# Patient Record
Sex: Female | Born: 1950
Health system: Southern US, Community
[De-identification: ages and names within clinical notes are randomized; demographics above are authoritative.]

## PROBLEM LIST (undated history)

## (undated) DIAGNOSIS — I10 Essential (primary) hypertension: Secondary | ICD-10-CM

## (undated) DIAGNOSIS — J449 Chronic obstructive pulmonary disease, unspecified: Secondary | ICD-10-CM

## (undated) DIAGNOSIS — I2510 Atherosclerotic heart disease of native coronary artery without angina pectoris: Secondary | ICD-10-CM

## (undated) HISTORY — PX: CARDIAC CATHETERIZATION: SHX172

---

## 2017-05-25 ENCOUNTER — Encounter (HOSPITAL_BASED_OUTPATIENT_CLINIC_OR_DEPARTMENT_OTHER): Payer: Self-pay | Admitting: *Deleted

## 2017-05-25 ENCOUNTER — Emergency Department (HOSPITAL_BASED_OUTPATIENT_CLINIC_OR_DEPARTMENT_OTHER): Payer: Medicare Other

## 2017-05-25 ENCOUNTER — Other Ambulatory Visit: Payer: Self-pay

## 2017-05-25 ENCOUNTER — Emergency Department (HOSPITAL_BASED_OUTPATIENT_CLINIC_OR_DEPARTMENT_OTHER)
Admission: EM | Admit: 2017-05-25 | Discharge: 2017-05-25 | Disposition: A | Discharge status: Home / Self Care | Payer: Medicare Other | Attending: Emergency Medicine | Admitting: Emergency Medicine

## 2017-05-25 DIAGNOSIS — W01198A Fall on same level from slipping, tripping and stumbling with subsequent striking against other object, initial encounter: Secondary | ICD-10-CM

## 2017-05-25 DIAGNOSIS — I2510 Atherosclerotic heart disease of native coronary artery without angina pectoris: Secondary | ICD-10-CM

## 2017-05-25 DIAGNOSIS — Y929 Unspecified place or not applicable: Secondary | ICD-10-CM

## 2017-05-25 DIAGNOSIS — J449 Chronic obstructive pulmonary disease, unspecified: Secondary | ICD-10-CM

## 2017-05-25 DIAGNOSIS — I10 Essential (primary) hypertension: Secondary | ICD-10-CM

## 2017-05-25 DIAGNOSIS — Z7982 Long term (current) use of aspirin: Secondary | ICD-10-CM

## 2017-05-25 DIAGNOSIS — Y9389 Activity, other specified: Secondary | ICD-10-CM

## 2017-05-25 DIAGNOSIS — Y998 Other external cause status: Secondary | ICD-10-CM

## 2017-05-25 DIAGNOSIS — Z79899 Other long term (current) drug therapy: Secondary | ICD-10-CM

## 2017-05-25 DIAGNOSIS — Z87891 Personal history of nicotine dependence: Secondary | ICD-10-CM

## 2017-05-25 DIAGNOSIS — S92425A Nondisplaced fracture of distal phalanx of left great toe, initial encounter for closed fracture: Principal | ICD-10-CM

## 2017-05-25 DIAGNOSIS — S99922A Unspecified injury of left foot, initial encounter: Secondary | ICD-10-CM | POA: Diagnosis present

## 2017-05-25 HISTORY — DX: Atherosclerotic heart disease of native coronary artery without angina pectoris: I25.10

## 2017-05-25 HISTORY — DX: Chronic obstructive pulmonary disease, unspecified: J44.9

## 2017-05-25 HISTORY — DX: Essential (primary) hypertension: I10

## 2017-05-25 MED ORDER — HYDROCODONE-ACETAMINOPHEN 5-325 MG PO TABS: 2.0000 | ORAL_TABLET | Freq: Once | ORAL | Status: DC

## 2017-05-25 MED ORDER — HYDROCODONE-ACETAMINOPHEN 5-325 MG PO TABS
1.0000 | ORAL_TABLET | Freq: Once | ORAL | Status: AC
  Administered 2017-05-25: 1 via ORAL
  Filled 2017-05-25: qty 1

## 2017-05-25 MED ORDER — TRAMADOL HCL 50 MG PO TABS: 50.0000 mg | ORAL_TABLET | Freq: Four times a day (QID) | ORAL | 0 refills | Status: AC | PRN

## 2017-05-25 MED ORDER — HYDROCODONE-ACETAMINOPHEN 5-325 MG PO TABS: 1.0000 | ORAL_TABLET | Freq: Four times a day (QID) | ORAL | 0 refills | Status: AC | PRN

## 2017-05-25 MED FILL — HYDROCODON-APAP 5-325: 5-325 | 2 days supply | Qty: 8 | Fill #0

## 2017-05-25 NOTE — Discharge Instructions (Addendum)
For your toe fracture,  - Wear the post-operative shoe for the next 2 weeks, as often as possible, to provide support - Use crutches as needed for the next week, for pain - you can put weight on your foot if you feel comfortable - Elevated the foot at night and when resting - Take over-the-counter ibuprofen or advil every 6 hours for pain. Take the prescribed medicine for severe pain.  Follow-up with your regular doctor in 7-10 days.

## 2017-05-25 NOTE — ED Notes (Signed)
ED Provider at bedside. 

## 2017-05-25 NOTE — ED Triage Notes (Signed)
Pt c/o left foot injury x 5 days

## 2017-05-25 NOTE — ED Provider Notes (Signed)
[facility] EMERGENCY DEPARTMENT Provider Note   CSN: [id number] Arrival date & time: 05/25/17  1310     History   Chief Complaint Chief Complaint  Patient presents with  . Foot Injury    HPI Kayla Bray is a 67 y.o. female.  HPI   67 yo F here with foot pain. Pt was walking 4 days ago when she tripped over a concrete block. Stubbed her left first toe on it then tripped. She was able to catch her balance and did not hit her head or fall. Since then, she's had an aching, throbbing, severe foot pain. Pain is worse when putting weight on it. She has tried soaking it in [employer], with minimal relief. No other drugs/meds taken. No open wounds. No prior wound to the area.  Past Medical History:  Diagnosis Date  . COPD (chronic obstructive pulmonary disease) (HCC)   . Coronary artery disease   . Hypertension     There are no active problems to display for this patient.   Past Surgical History:  Procedure Laterality Date  . CARDIAC CATHETERIZATION       OB History   None      Home Medications    Prior to Admission medications   Medication Sig Start Date End Date Taking? Authorizing Provider  aspirin 81 MG chewable tablet Chew by mouth daily.   Yes [provider]  cloNIDine (CATAPRES) 0.1 MG tablet Take 0.1 mg by mouth 2 (two) times daily.   Yes [provider]  hydrALAZINE (APRESOLINE) 25 MG tablet Take 25 mg by mouth 3 (three) times daily.   Yes [provider]  HYDROcodone-acetaminophen (NORCO/VICODIN) 5-325 MG tablet Take 1 tablet by mouth every 6 (six) hours as needed for severe pain. 05/25/17   Shaune Pollack, MD  traMADol (ULTRAM) 50 MG tablet Take 1 tablet (50 mg total) by mouth every 6 (six) hours as needed for severe pain. 05/25/17   Shaune Pollack, MD    Family History No family history on file.  Social History Social History   Tobacco Use  . Smoking status: Former Smoker  Substance Use Topics  . Alcohol use:  Not Currently  . Drug use: Never     Allergies   Meloxicam and Motrin [ibuprofen]   Review of Systems Review of Systems  Constitutional: Negative for chills and fever.  Respiratory: Negative for shortness of breath.   Cardiovascular: Negative for chest pain.  Musculoskeletal: Positive for arthralgias, gait problem and joint swelling. Negative for neck pain.  Skin: Negative for rash and wound.  Allergic/Immunologic: Negative for immunocompromised state.  Neurological: Negative for weakness and numbness.  Hematological: Does not bruise/bleed easily.  All other systems reviewed and are negative.    Physical Exam Updated Vital Signs BP (!) 202/106 (BP Location: Right Arm)   Pulse 62   Temp 98.1 F (36.7 C) (Oral)   Resp 20   Ht  (1.854 m)   Wt 74.8 kg (165 lb)   SpO2 100%   BMI 21.77 kg/m   Physical Exam  Constitutional: She is oriented to person, place, and time. She appears well-developed and well-nourished. No distress.  HENT:  Head: Normocephalic and atraumatic.  Eyes: Conjunctivae are normal.  Neck: Neck supple.  Cardiovascular: Normal rate, regular rhythm and normal heart sounds.  Pulmonary/Chest: Effort normal. No respiratory distress. She has no wheezes.  Abdominal: She exhibits no distension.  Musculoskeletal: She exhibits no edema.  Neurological: She is alert and oriented  to person, place, and time. She exhibits normal muscle tone.  Skin: Skin is warm. Capillary refill takes less than 2 seconds. No rash noted.  Nursing note and vitals reviewed.   LOWER EXTREMITY EXAM: LEFT  INSPECTION & PALPATION: Moderate TTP overlying dorsum of distal first toe, with mild edema. No open wounds. Pain with palpation. No other pain or swelling. No open wounds. No malrotation or deformity.  SENSORY: sensation is intact to light touch in:  Superficial peroneal nerve distribution (over dorsum of foot) Deep peroneal nerve distribution (over first dorsal web  space) Sural nerve distribution (over lateral aspect 5th metatarsal) Saphenous nerve distribution (over medial instep)  MOTOR:  + Motor EHL (great toe dorsiflexion) + FHL (great toe plantar flexion)  + TA (ankle dorsiflexion)  + GSC (ankle plantar flexion)  VASCULAR: 2+ dorsalis pedis and posterior tibialis pulses Capillary refill < 2 sec, toes warm and well-perfused  COMPARTMENTS: Soft, warm, well-perfused No pain with passive extension No parethesias     ED Treatments / Results  Labs (all labs ordered are listed, but only abnormal results are displayed) Labs Reviewed - No data to display  EKG None  Radiology Dg Foot Complete Left  Result Date: 05/25/2017 CLINICAL DATA:  Anterior foot pain after hitting something 4 days ago. EXAM: LEFT FOOT - COMPLETE 3+ VIEW COMPARISON:  None. FINDINGS: There is a nondisplaced fracture through the fifth proximal phalanx shaft with surrounding sclerosis. Oblique lucency through the medial base of the first distal phalanx. Joint spaces are preserved. Minimal degenerative changes of the first MTP joint. Osteopenia. Vascular calcifications. IMPRESSION: 1. Oblique lucency through the medial base of the first distal phalanx may reflect a nondisplaced fracture. Correlate with point tenderness. 2. Nondisplaced fracture through the fifth proximal phalanx shaft with surrounding sclerosis and somewhat corticated margins, suggesting chronic nonunited fracture. Correlate with point tenderness. Electronically Signed   By: Obie Dredge M.D.   On: 05/25/2017 13:41    Procedures Procedures (including critical care time)  Medications Ordered in ED Medications  HYDROcodone-acetaminophen (NORCO/VICODIN) 5-325 MG per tablet 1 tablet (1 tablet Oral Given 05/25/17 1535)     Initial Impression / Assessment and Plan / ED Course  I have reviewed the triage vital signs and the nursing notes.  Pertinent labs & imaging results that were available during my  care of the patient were reviewed by me and considered in my medical decision making (see chart for details).     67 yo F here with foot pain s/p tripping injury 4-5 days ago. Imaging shows non-displaced distal phalanx fx, likely old fifth toe fx. No malrotation. No other injuries. No fall, head injury, or other wounds/areas of pain. Will place in post-op shoe, buddy tape, have PCP follow-up. Tramadol PRN severe pain with tylenol regularly. RICE.  Final Clinical Impressions(s) / ED Diagnoses   Final diagnoses:  Closed nondisplaced fracture of distal phalanx of left great toe, initial encounter       Shaune Pollack, MD 05/25/17 1541

## 2019-07-26 ENCOUNTER — Emergency Department (HOSPITAL_BASED_OUTPATIENT_CLINIC_OR_DEPARTMENT_OTHER): Payer: Medicare Other

## 2019-07-26 ENCOUNTER — Other Ambulatory Visit: Payer: Self-pay

## 2019-07-26 ENCOUNTER — Encounter (HOSPITAL_BASED_OUTPATIENT_CLINIC_OR_DEPARTMENT_OTHER): Payer: Self-pay | Admitting: *Deleted

## 2019-07-26 ENCOUNTER — Emergency Department (HOSPITAL_BASED_OUTPATIENT_CLINIC_OR_DEPARTMENT_OTHER)
Admission: EM | Admit: 2019-07-26 | Discharge: 2019-07-26 | Disposition: A | Discharge status: Home / Self Care | Payer: Medicare Other | Attending: Emergency Medicine | Admitting: Emergency Medicine

## 2019-07-26 DIAGNOSIS — Z79899 Other long term (current) drug therapy: Secondary | ICD-10-CM | POA: Insufficient documentation

## 2019-07-26 DIAGNOSIS — Z7982 Long term (current) use of aspirin: Secondary | ICD-10-CM | POA: Diagnosis not present

## 2019-07-26 DIAGNOSIS — I2510 Atherosclerotic heart disease of native coronary artery without angina pectoris: Secondary | ICD-10-CM | POA: Insufficient documentation

## 2019-07-26 DIAGNOSIS — M79675 Pain in left toe(s): Secondary | ICD-10-CM | POA: Diagnosis present

## 2019-07-26 DIAGNOSIS — J449 Chronic obstructive pulmonary disease, unspecified: Secondary | ICD-10-CM | POA: Diagnosis not present

## 2019-07-26 DIAGNOSIS — M10072 Idiopathic gout, left ankle and foot: Secondary | ICD-10-CM | POA: Insufficient documentation

## 2019-07-26 DIAGNOSIS — I10 Essential (primary) hypertension: Secondary | ICD-10-CM | POA: Diagnosis not present

## 2019-07-26 DIAGNOSIS — F17200 Nicotine dependence, unspecified, uncomplicated: Secondary | ICD-10-CM | POA: Insufficient documentation

## 2019-07-26 DIAGNOSIS — M109 Gout, unspecified: Secondary | ICD-10-CM

## 2019-07-26 MED ORDER — PREDNISONE 50 MG PO TABS
60.0000 mg | ORAL_TABLET | Freq: Once | ORAL | Status: AC
  Administered 2019-07-26: 60 mg via ORAL
  Filled 2019-07-26: qty 1

## 2019-07-26 MED ORDER — PREDNISONE 10 MG PO TABS
40.0000 mg | ORAL_TABLET | Freq: Every day | ORAL | 0 refills | Status: AC
Start: 2019-07-26 — End: 2019-07-31

## 2019-07-26 MED FILL — predniSONE 10 MG TABS: 10 | 5 days supply | Qty: 20 | Fill #0

## 2019-07-26 NOTE — ED Provider Notes (Signed)
[facility] EMERGENCY DEPARTMENT Provider Note   CSN: [id number] Arrival date & time: 07/26/19  1400     History Chief Complaint  Patient presents with  . Foot Pain    Kayla Bray is a 69 y.o. female.  Patient is a 69 year old female has past medical history of COPD, hypertension presenting to the emergency department for complaints of left big toe pain.  Patient reports that yesterday she began to feel itching and pain in the MTP of her great toe on the left.  She reports that she feels like a spider bit her but did not see any kind of insect or spider or feel a specific bite.  She reports rubbing alcohol on the area but she is having significant pain.  Able to ambulate.  No fever, chills, rash        Past Medical History:  Diagnosis Date  . COPD (chronic obstructive pulmonary disease) (HCC)   . Coronary artery disease   . Hypertension     There are no problems to display for this patient.   Past Surgical History:  Procedure Laterality Date  . CARDIAC CATHETERIZATION       OB History   No obstetric history on file.     No family history on file.  Social History   Tobacco Use  . Smoking status: Current Every Day Smoker  . Smokeless tobacco: Never Used  Substance Use Topics  . Alcohol use: Not Currently  . Drug use: Never    Home Medications Prior to Admission medications   Medication Sig Start Date End Date Taking? Authorizing Provider  aspirin 81 MG chewable tablet Chew by mouth daily.   Yes [provider]  cloNIDine (CATAPRES) 0.1 MG tablet Take 0.1 mg by mouth 2 (two) times daily.   Yes [provider]  hydrALAZINE (APRESOLINE) 25 MG tablet Take 25 mg by mouth 3 (three) times daily.   Yes [provider]  HYDROcodone-acetaminophen (NORCO/VICODIN) 5-325 MG tablet Take 1 tablet by mouth every 6 (six) hours as needed for severe pain. 05/25/17   Shaune Pollack, MD  traMADol (ULTRAM) 50 MG tablet Take 1 tablet (50 mg  total) by mouth every 6 (six) hours as needed for severe pain. 05/25/17   Shaune Pollack, MD    Allergies    Meloxicam and Motrin [ibuprofen]  Review of Systems   Review of Systems  Constitutional: Negative for chills and fever.  Musculoskeletal: Positive for arthralgias and joint swelling. Negative for back pain and gait problem.  Neurological: Negative for numbness.  All other systems reviewed and are negative.   Physical Exam Updated Vital Signs BP (!) 158/98   Pulse 91   Temp 98.9 F (37.2 C) (Oral)   Resp 18   Ht 6\' 1"  (1.854 m)   Wt 72.6 kg   SpO2 98%   BMI 21.11 kg/m   Physical Exam Vitals and nursing note reviewed.  Constitutional:      General: She is not in acute distress.    Appearance: Normal appearance. She is not ill-appearing, toxic-appearing or diaphoretic.  HENT:     Head: Normocephalic.  Eyes:     Conjunctiva/sclera: Conjunctivae normal.  Pulmonary:     Effort: Pulmonary effort is normal.  Musculoskeletal:     Comments: Swelling with  TTP to the left MTP of the great toe.  Skin:    General: Skin is dry.     Findings: No bruising or erythema.  Neurological:     General:  No focal deficit present.     Mental Status: She is alert.     Sensory: No sensory deficit.  Psychiatric:        Mood and Affect: Mood normal.     ED Results / Procedures / Treatments   Labs (all labs ordered are listed, but only abnormal results are displayed) Labs Reviewed - No data to display  EKG None  Radiology No results found.  Procedures Procedures (including critical care time)  Medications Ordered in ED Medications  predniSONE (DELTASONE) tablet 60 mg (has no administration in time range)    ED Course  I have reviewed the triage vital signs and the nursing notes.  Pertinent labs & imaging results that were available during my care of the patient were reviewed by me and considered in my medical decision making (see chart for details).  Clinical  Course as of Jul 27 822  Tue Jul 26, 2019  1548 Patient with acute left great toe MTP pain, atraumatic. Findings on physical exam and xray suggestive of gout. Tx with prednisone, advised on return precautions .   [KM]    Clinical Course User Index [KM] Jeral Pinch   MDM Rules/Calculators/A&P                         Based on review of vitals, medical screening exam, lab work and/or imaging, there does not appear to be an acute, emergent etiology for the patient's symptoms. Counseled pt on good return precautions and encouraged both PCP and ED follow-up as needed.  Prior to discharge, I also discussed incidental imaging findings with patient in detail and advised appropriate, recommended follow-up in detail.  Clinical Impression: 1. Acute gout involving toe of left foot, unspecified cause     Disposition: Discharge  Prior to providing a prescription for a controlled substance, I independently reviewed the patient's recent prescription history on the [place] Controlled Substance Reporting System. The patient had no recent or regular prescriptions and was deemed appropriate for a brief, less than 3 day prescription of narcotic for acute analgesia.  This note was prepared with assistance of Conservation officer, historic buildings. Occasional wrong-word or sound-a-like substitutions may have occurred due to the inherent limitations of voice recognition software.  Final Clinical Impression(s) / ED Diagnoses Final diagnoses:  None    Rx / DC Orders ED Discharge Orders    None       Tegeler, Canary Brim, MD 07/27/19 1552

## 2019-07-26 NOTE — ED Triage Notes (Signed)
Swelling to the top of her left foot x 3 days. She feels like she was bit by a spider.

## 2019-07-26 NOTE — Discharge Instructions (Signed)
Thank you for allowing me to care for you today. Please return to the emergency department if you have new or worsening symptoms. Take your medications as instructed.  ° °

## 2020-05-07 ENCOUNTER — Emergency Department (HOSPITAL_COMMUNITY): Payer: No Typology Code available for payment source

## 2020-05-07 ENCOUNTER — Emergency Department (HOSPITAL_COMMUNITY)
Admission: EM | Admit: 2020-05-07 | Discharge: 2020-05-08 | Disposition: A | Discharge status: Home / Self Care | Payer: No Typology Code available for payment source | Attending: Emergency Medicine | Admitting: Emergency Medicine

## 2020-05-07 ENCOUNTER — Ambulatory Visit (HOSPITAL_COMMUNITY)
Admission: EM | Admit: 2020-05-07 | Discharge: 2020-05-07 | Disposition: A | Discharge status: Home / Self Care | Payer: Medicare Other

## 2020-05-07 ENCOUNTER — Other Ambulatory Visit: Payer: Self-pay

## 2020-05-07 ENCOUNTER — Encounter (HOSPITAL_COMMUNITY): Payer: Self-pay

## 2020-05-07 DIAGNOSIS — N179 Acute kidney failure, unspecified: Secondary | ICD-10-CM | POA: Insufficient documentation

## 2020-05-07 DIAGNOSIS — F17200 Nicotine dependence, unspecified, uncomplicated: Secondary | ICD-10-CM | POA: Insufficient documentation

## 2020-05-07 DIAGNOSIS — I10 Essential (primary) hypertension: Secondary | ICD-10-CM | POA: Insufficient documentation

## 2020-05-07 DIAGNOSIS — Y92410 Unspecified street and highway as the place of occurrence of the external cause: Secondary | ICD-10-CM | POA: Insufficient documentation

## 2020-05-07 DIAGNOSIS — Z7982 Long term (current) use of aspirin: Secondary | ICD-10-CM | POA: Diagnosis not present

## 2020-05-07 DIAGNOSIS — J449 Chronic obstructive pulmonary disease, unspecified: Secondary | ICD-10-CM | POA: Diagnosis not present

## 2020-05-07 DIAGNOSIS — S0990XA Unspecified injury of head, initial encounter: Secondary | ICD-10-CM | POA: Diagnosis present

## 2020-05-07 DIAGNOSIS — I2510 Atherosclerotic heart disease of native coronary artery without angina pectoris: Secondary | ICD-10-CM | POA: Diagnosis not present

## 2020-05-07 DIAGNOSIS — S060X0A Concussion without loss of consciousness, initial encounter: Secondary | ICD-10-CM | POA: Insufficient documentation

## 2020-05-07 LAB — BASIC METABOLIC PANEL
Anion gap: 10 (ref 5–15)
BUN: 38 mg/dL — ABNORMAL HIGH (ref 8–23)
CO2: 22 mmol/L (ref 22–32)
Calcium: 8.7 mg/dL — ABNORMAL LOW (ref 8.9–10.3)
Chloride: 109 mmol/L (ref 98–111)
Creatinine, Ser: 2.13 mg/dL — ABNORMAL HIGH (ref 0.44–1.00)
GFR, Estimated: 24 mL/min — ABNORMAL LOW (ref >60)
Glucose, Bld: 142 mg/dL — ABNORMAL HIGH (ref 70–99)
Potassium: 3.7 mmol/L (ref 3.5–5.1)
Sodium: 141 mmol/L (ref 135–145)

## 2020-05-07 LAB — CBC WITH DIFFERENTIAL/PLATELET
Abs Immature Granulocytes: 0.02 10*3/uL (ref 0.00–0.07)
Basophils Absolute: 0 10*3/uL (ref 0.0–0.1)
Basophils Relative: 0 %
Eosinophils Absolute: 0.1 10*3/uL (ref 0.0–0.5)
Eosinophils Relative: 2 %
HCT: 40.8 % (ref 36.0–46.0)
Hemoglobin: 12.7 g/dL (ref 12.0–15.0)
Immature Granulocytes: 0 %
Lymphocytes Relative: 24 %
Lymphs Abs: 1.3 10*3/uL (ref 0.7–4.0)
MCH: 28.2 pg (ref 26.0–34.0)
MCHC: 31.1 g/dL (ref 30.0–36.0)
MCV: 90.5 fL (ref 80.0–100.0)
Monocytes Absolute: 0.4 10*3/uL (ref 0.1–1.0)
Monocytes Relative: 8 %
Neutro Abs: 3.6 10*3/uL (ref 1.7–7.7)
Neutrophils Relative %: 66 %
Platelets: 214 10*3/uL (ref 150–400)
RBC: 4.51 MIL/uL (ref 3.87–5.11)
RDW: 14.3 % (ref 11.5–15.5)
WBC: 5.4 10*3/uL (ref 4.0–10.5)
nRBC: 0 % (ref 0.0–0.2)

## 2020-05-07 MED ORDER — ACETAMINOPHEN 325 MG PO TABS
650.0000 mg | ORAL_TABLET | Freq: Once | ORAL | Status: AC
  Administered 2020-05-07: 650 mg via ORAL
  Filled 2020-05-07: qty 2

## 2020-05-07 MED ORDER — HYDRALAZINE HCL 25 MG PO TABS
50.0000 mg | ORAL_TABLET | Freq: Once | ORAL | Status: AC
  Administered 2020-05-07: 50 mg via ORAL
  Filled 2020-05-07: qty 2

## 2020-05-07 MED ORDER — CLONIDINE HCL 0.2 MG PO TABS
0.2000 mg | ORAL_TABLET | Freq: Once | ORAL | Status: AC
  Administered 2020-05-07: 0.2 mg via ORAL
  Filled 2020-05-07: qty 1

## 2020-05-07 MED ORDER — HYDROCODONE-ACETAMINOPHEN 5-325 MG PO TABS
1.0000 | ORAL_TABLET | Freq: Once | ORAL | Status: AC
  Administered 2020-05-07: 1 via ORAL
  Filled 2020-05-07: qty 1

## 2020-05-07 NOTE — ED Triage Notes (Signed)
Patient is being discharged from the Urgent Care and sent to the Emergency Department per private vehicle . Per Jenna Luo, patient is in need of higher level of care due to head injury  And hypertension . Patient is aware and verbalizes understanding of plan of care.  Vitals:   05/07/20 1920  BP: (!) 224/117  Pulse: 66  Resp: 18  Temp: 98.8 F (37.1 C)  SpO2: 100%
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
## 2020-05-07 NOTE — ED Triage Notes (Signed)
Pt denies LOC but is a little forgetful of what exactly happened, denies nausea or vomiting

## 2020-05-08 NOTE — ED Notes (Signed)
Pt discharged and ambulated out of the ED without difficulty. 

## 2020-05-08 NOTE — Discharge Instructions (Addendum)
Please see your regular doctor in 2 weeks to have a recheck of your blood pressure and recheck of your kidneys Your kidney labs today reveal a creatinine of 2.1.  Please give this information to your primary doctor.

## 2020-05-08 NOTE — ED Provider Notes (Signed)
I assumed care in signout to follow-up on CT imaging.  CT head and C-spine are negative. Patient reports mild headache but no other acute complaints. Denies any chest/abdominal pain.  She reports she has been ambulatory.  She is not on anticoagulation Plan for discharge home.  She will follow-up with her PCP in 2 weeks for recheck of her blood pressure and creatinine.  Patient understands this BP (!) 189/119   Pulse (!) 49   Temp 98.1 F (36.7 C) (Oral)   Resp 15   SpO2 99%     Zadie Rhine, MD 05/08/20 0124
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
## 2022-11-14 DEATH — deceased
